# Patient Record
Sex: Male | Born: 1975 | Race: White | Hispanic: No | Marital: Married | Smoking: Never smoker
Health system: Southern US, Community
[De-identification: ages and names within clinical notes are randomized; demographics above are authoritative.]

## PROBLEM LIST (undated history)

## (undated) DIAGNOSIS — K5010 Crohn's disease of large intestine without complications: Secondary | ICD-10-CM

## (undated) HISTORY — PX: COLONOSCOPY: SHX174

## (undated) HISTORY — DX: Crohn's disease of large intestine without complications: K50.10

## (undated) HISTORY — PX: MOHS SURGERY: SHX181

## (undated) HISTORY — PX: ANAL FISSURECTOMY: SUR608

---

## 2009-04-07 ENCOUNTER — Emergency Department (HOSPITAL_COMMUNITY): Admission: EM | Admit: 2009-04-07 | Discharge: 2009-04-07 | Payer: Self-pay | Admitting: Emergency Medicine

## 2010-10-25 LAB — URINALYSIS, ROUTINE W REFLEX MICROSCOPIC
Ketones, ur: 40 mg/dL — AB
Nitrite: NEGATIVE
Protein, ur: 30 mg/dL — AB
pH: 6 (ref 5.0–8.0)

## 2010-10-25 LAB — URINE MICROSCOPIC-ADD ON

## 2010-10-25 LAB — URINE CULTURE: Culture: NO GROWTH

## 2011-02-20 IMAGING — CT CT PELVIS W/O CM
2 of 4 series · 17 of 46 positions shown, 19 images · non-contrast
Comparison: None

CT ABDOMEN

CLINICAL DATA: Right-sided flank, abdominal, and pelvic pain with
hematuria and dysuria.

CT ABDOMEN AND PELVIS WITHOUT CONTRAST
TECHNIQUE: Multidetector CT imaging of the abdomen and pelvis was
performed following the standard protocol without intravenous
contrast.

[Series 2: a/p w/o 5.0 b31f st · axial · non-contrast · 0.78mm/px · z∈[-431,+9]mm · 14 of 96 slices shown, 16 images]
[im 4/96  soft-tissue]
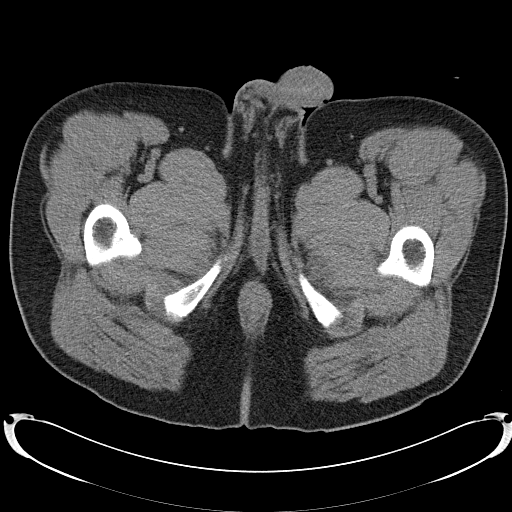
[im 4/96  bone]
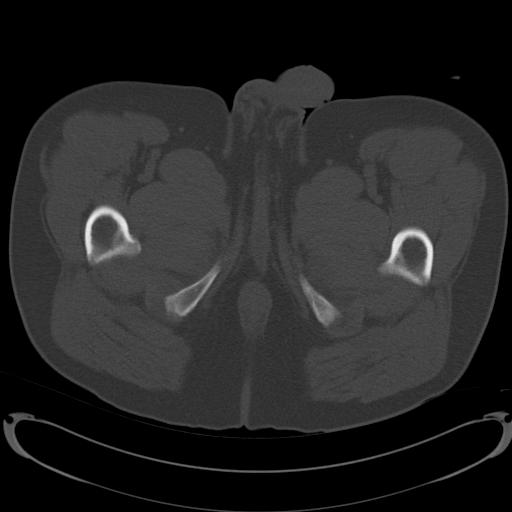
[im 12/96  soft-tissue]
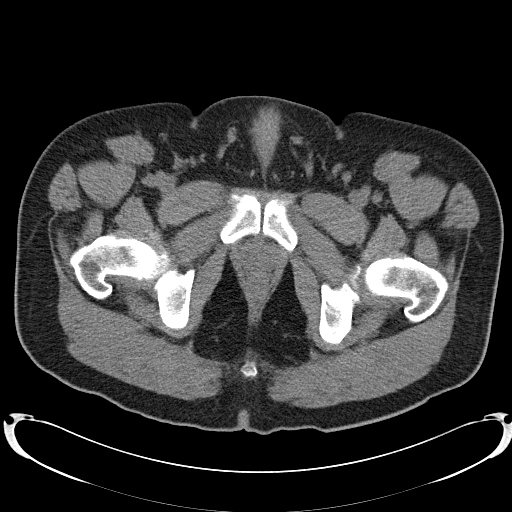
[im 20/96  soft-tissue]
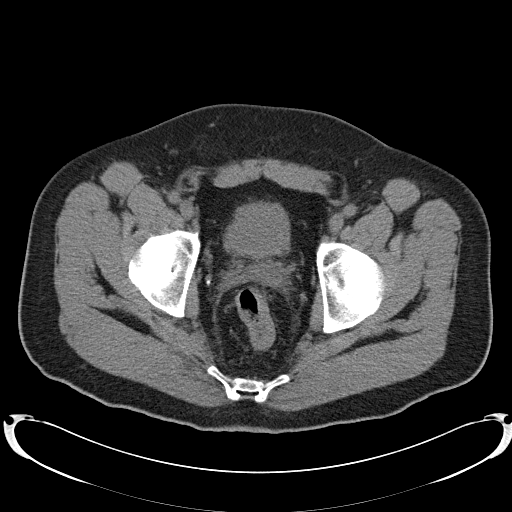
[im 24/96  soft-tissue]
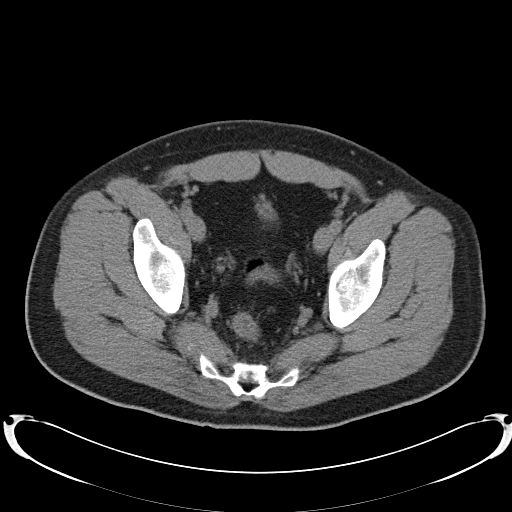
[im 32/96  soft-tissue]
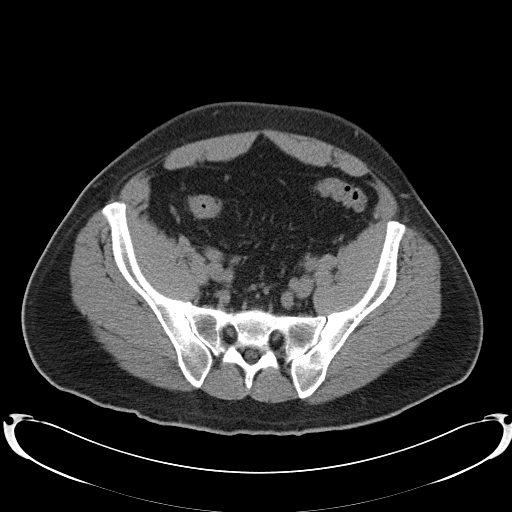
[im 40/96  soft-tissue]
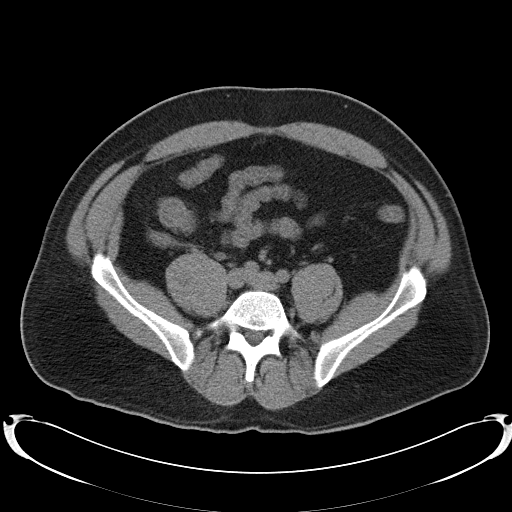
[im 44/96  soft-tissue]
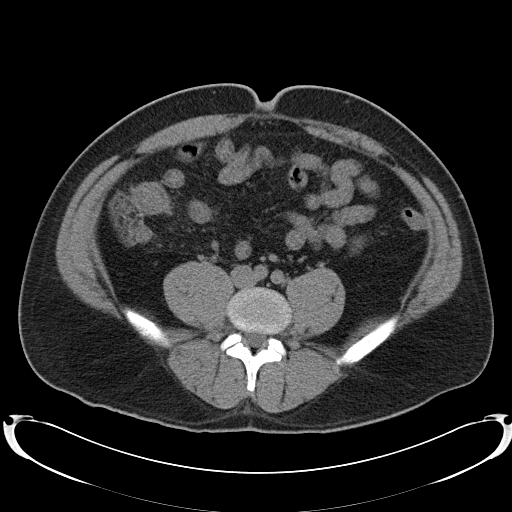
[im 52/96  soft-tissue]
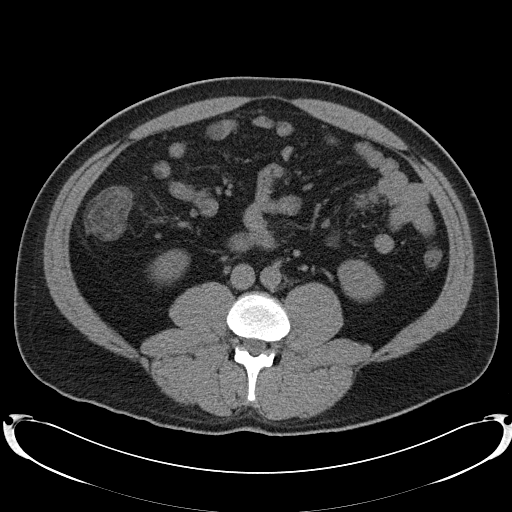
[im 56/96  soft-tissue]
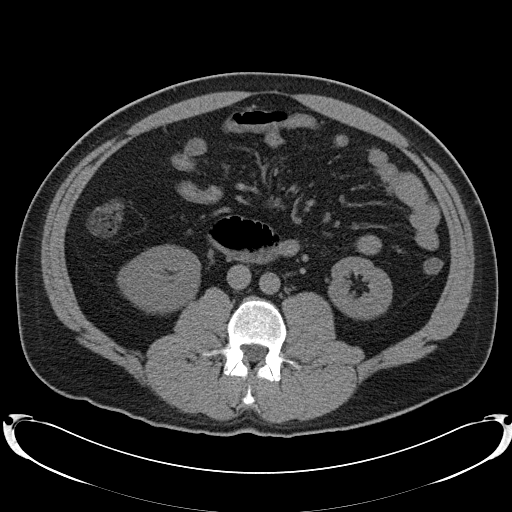
[im 56/96  bone]
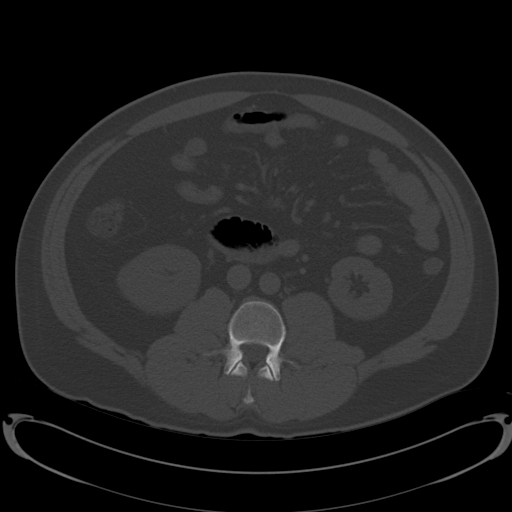
[im 64/96  soft-tissue]
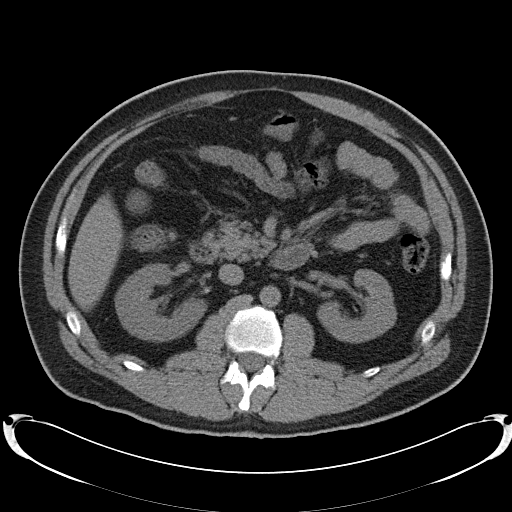
[im 72/96  soft-tissue]
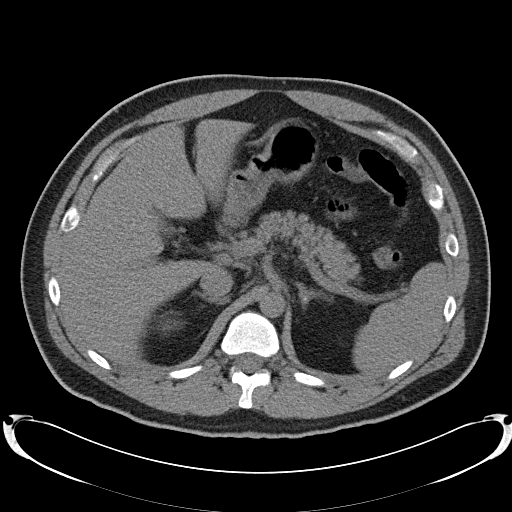
[im 76/96  soft-tissue]
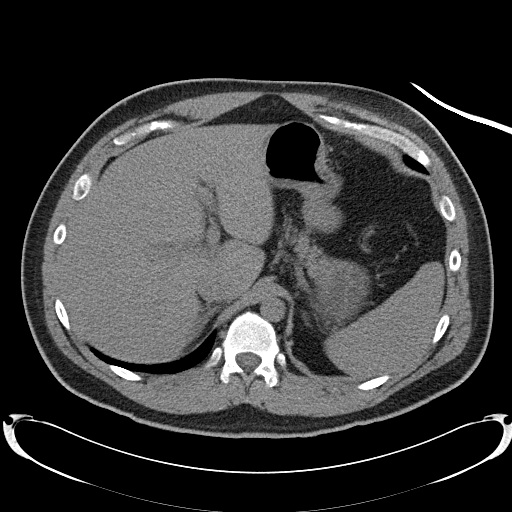
[im 84/96  soft-tissue]
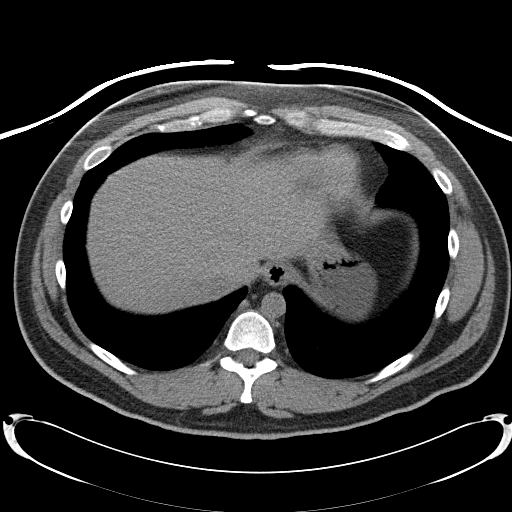
[im 92/96  soft-tissue]
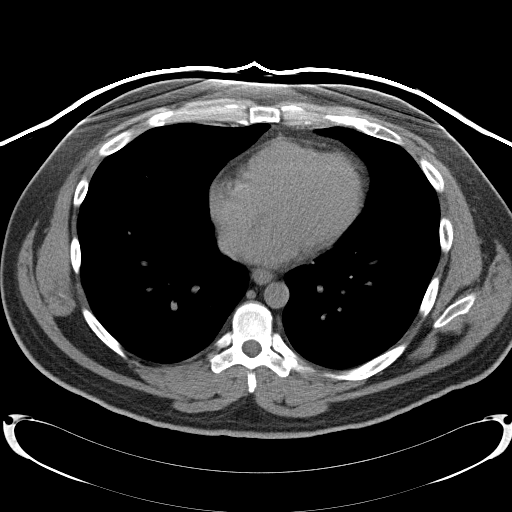

[Series 602: cor · coronal · 0.93mm/px · 3 of 143 slices shown]
[im 48/143  soft-tissue]
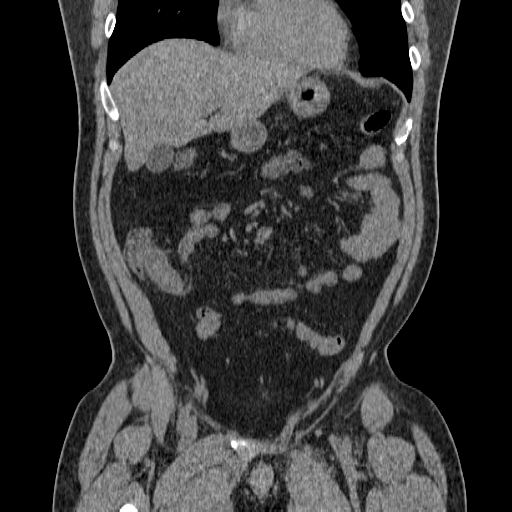
[im 64/143  soft-tissue]
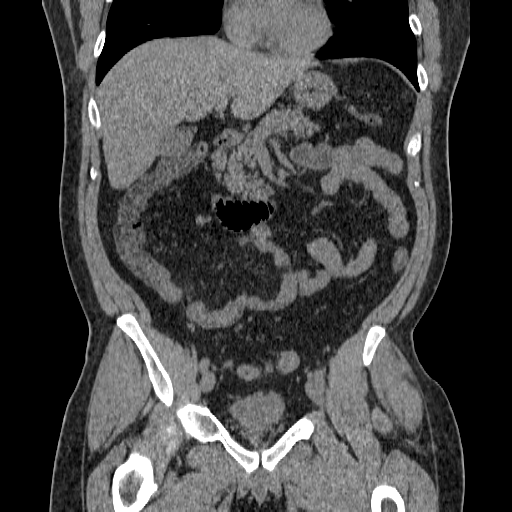
[im 79/143  soft-tissue]
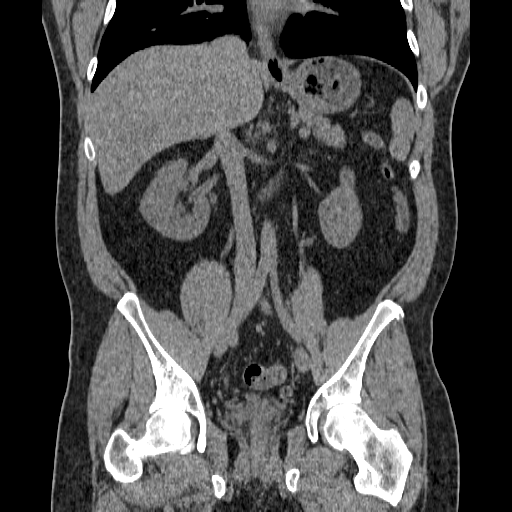

[17 of 46 positions shown; findings below may reference images not displayed]

FINDINGS: The liver, spleen, adrenal glands, pancreas,
gallbladder, and left kidney are unremarkable.
Mild right hydroureteronephrosis is caused by a 1 mm right UVJ
calculus within the pelvis.
Please note that parenchymal abnormalities may be missed as
intravenous contrast was not administered.
No free fluid, enlarged lymph nodes, biliary dilation or abdominal
aortic aneurysm identified.
The visualized bowel is unremarkable.  The appendix is normal.
No acute or suspicious bony abnormalities are identified.
IMPRESSION: Mild right hydroureteronephrosis caused by a 1 mm right UVJ
calculus.

CT PELVIS
FINDINGS: A 1 mm right UVJ calculus is identified causing mild
right hydroureteronephrosis.
There is no evidence of free fluid or enlarged lymph nodes within
the pelvis.
The bowel and bladder are within normal limits otherwise.
No acute or suspicious bony abnormalities are noted.
Small bilateral inguinal hernias containing fat are noted.
IMPRESSION: 1 mm right UVJ calculus causing mild right hydroureteronephrosis.

## 2012-03-31 ENCOUNTER — Ambulatory Visit (INDEPENDENT_AMBULATORY_CARE_PROVIDER_SITE_OTHER): Payer: BC Managed Care – PPO | Admitting: Family Medicine

## 2012-03-31 ENCOUNTER — Ambulatory Visit (HOSPITAL_COMMUNITY)
Admission: RE | Admit: 2012-03-31 | Discharge: 2012-03-31 | Disposition: A | Discharge status: Home / Self Care | Payer: BC Managed Care – PPO | Source: Ambulatory Visit | Attending: Family Medicine | Admitting: Family Medicine

## 2012-03-31 ENCOUNTER — Other Ambulatory Visit: Payer: Self-pay | Admitting: Radiology

## 2012-03-31 VITALS — BP 140/92 | HR 83 | Temp 98.5°F | Resp 16 | Ht 65.0 in | Wt 200.0 lb

## 2012-03-31 DIAGNOSIS — R600 Localized edema: Secondary | ICD-10-CM

## 2012-03-31 DIAGNOSIS — M79609 Pain in unspecified limb: Secondary | ICD-10-CM

## 2012-03-31 DIAGNOSIS — M79606 Pain in leg, unspecified: Secondary | ICD-10-CM

## 2012-03-31 DIAGNOSIS — R609 Edema, unspecified: Secondary | ICD-10-CM

## 2012-03-31 DIAGNOSIS — M7989 Other specified soft tissue disorders: Secondary | ICD-10-CM

## 2012-03-31 LAB — POCT CBC
Granulocyte percent: 74.8 %G (ref 37–80)
HCT, POC: 52.6 % (ref 43.5–53.7)
MCV: 91.4 fL (ref 80–97)
RBC: 5.76 M/uL (ref 4.69–6.13)

## 2012-03-31 MED ORDER — METHYLPREDNISOLONE 4 MG PO KIT: PACK | ORAL | Status: AC

## 2012-03-31 NOTE — Progress Notes (Signed)
*  PRELIMINARY RESULTS* Vascular Ultrasound Right lower extremity venous duplex has been completed.  Preliminary findings: no evidence of DVT.  Called report to Amy at Dr. Frederik Pear office.  Farrel Demark, RDMS, RVT  03/31/2012, 12:28 PM

## 2012-03-31 NOTE — Progress Notes (Signed)
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 About a week ago he developed swelling in his right foot. It is persisted. On the weekend when he had it elevated and the swelling seemed to go down some, but when he got back up on it it is swollen again. He's developed some pain and tenderness on the medial aspect of the right calf, extending on up medial to the D. and for several inches along the lower medial thigh. He has no history of clotting disorder or clot problems, and no family history of it. He has not been flying. He had no injuries.  Objective: Tender along the left medial thigh and calf as noted above. No erythema. The leg is visibly a little swollen compared to the left. The foot is very swollen and puffy looking. Pedal pulses are present, though a little harder to palpate due to the swelling. Homans sign is essentially negative. He gets pain along the medial ankle with with dorsiflexion of the foot, but did not seem to complain of deep calf pain.  Assessment: Right leg swollen and painful Suspicion of DVT right leg  Plan: Ultrasound CBC, sed rate  Results for orders placed in visit on 03/31/12  POCT CBC      Component Value Range   WBC 8.0  4.6 - 10.2 K/uL   Lymph, poc 1.3  0.6 - 3.4   POC LYMPH PERCENT 16.2  10 - 50 %L   MID (cbc) 0.7  0 - 0.9   POC MID % 9.0  0 - 12 %M   POC Granulocyte 6.0  2 - 6.9   Granulocyte percent 74.8  37 - 80 %G   RBC 5.76  4.69 - 6.13 M/uL   Hemoglobin 16.4  14.1 - 18.1 g/dL   HCT, POC 14.7  82.9 - 53.7 %   MCV 91.4  80 - 97 fL   MCH, POC 28.5  27 - 31.2 pg   MCHC 31.2 (*) 31.8 - 35.4 g/dL   RDW, POC 56.2     Platelet Count, POC 334  142 - 424 K/uL   MPV 9.5  0 - 99.8 fL  POCT SEDIMENTATION RATE      Component Value Range   POCT SED RATE 23 (*) 0 - 22 mm/hr   Ultrasound did not show any evidence of clot. I am not sure exactly what going on but he may have just some venous  inflammation. I am going ahead and treating him with a Medrol Dosepak if that is okay with him. He does have Crohn's disease and he speaks careful about his medications, so we need to avoid NSAIDs.

## 2012-03-31 NOTE — Patient Instructions (Signed)
Daniel Mcintyre . 1st floor admitting to register as outpatient, then to vascular lab for scan.

## 2012-03-31 NOTE — Telephone Encounter (Signed)
done

## 2012-03-31 NOTE — Addendum Note (Signed)
Addended by: HOPPER, DAVID H on: 03/31/2012 02:26 PM   Modules accepted: Orders

## 2012-03-31 NOTE — Telephone Encounter (Signed)
Per Dr. Alwyn Ren, advise patient that sed rate minimally elevated at 23 and that he recommends course of Medrol dosepack to help alleviate symptoms.  Patient aware and ok with plan.  To MD, please rx to [facility] on [address]

## 2012-03-31 NOTE — Progress Notes (Signed)
  Subjective:    Patient ID: Daniel Mcintyre, male    DOB: 1975-11-02, 36 y.o.   MRN: [id number]  HPI    Review of Systems     Objective:   Physical Exam        Assessment & Plan:

## 2012-03-31 NOTE — Telephone Encounter (Signed)
Patient went for doppler study, [place] at vascular lab advises scan negative for DVT. Patient advised and told we will call with further instructions. Please advise and I will call patient, I have chart.

## 2012-04-24 ENCOUNTER — Ambulatory Visit: Payer: BC Managed Care – PPO

## 2012-04-24 ENCOUNTER — Ambulatory Visit (INDEPENDENT_AMBULATORY_CARE_PROVIDER_SITE_OTHER): Payer: BC Managed Care – PPO | Admitting: Family Medicine

## 2012-04-24 VITALS — BP 127/78 | HR 88 | Temp 98.2°F | Resp 18 | Ht 64.5 in | Wt 201.8 lb

## 2012-04-24 DIAGNOSIS — M79609 Pain in unspecified limb: Secondary | ICD-10-CM

## 2012-04-24 DIAGNOSIS — R600 Localized edema: Secondary | ICD-10-CM

## 2012-04-24 DIAGNOSIS — M79671 Pain in right foot: Secondary | ICD-10-CM

## 2012-04-24 DIAGNOSIS — R609 Edema, unspecified: Secondary | ICD-10-CM

## 2012-04-24 LAB — POCT URINALYSIS DIPSTICK
Bilirubin, UA: NEGATIVE
Blood, UA: NEGATIVE
Glucose, UA: NEGATIVE
Leukocytes, UA: NEGATIVE
Nitrite, UA: NEGATIVE
Protein, UA: NEGATIVE
Spec Grav, UA: >=1.03
Urobilinogen, UA: 0.2
pH, UA: 5.5

## 2012-04-24 NOTE — Progress Notes (Signed)
[facility]:  Office Visit  Chief Complaint:  Chief Complaint  Patient presents with  . Foot Swelling    R Foot swelling x 1 month    HPI: Daniel Mcintyre is a 36 y.o. male who complains of: Right foot swelling and leg pain since 03/31/2012, venous doppler was negative for DVT. He is on Asacol and Budesonide for Crohn's colitis which can cause some edema but it is very minimal , < 5%. He has been on these medicine for awhile so this is something new. He also drives to [facility] to work, he is a Theme park manager in the ICU. Denies any chest pain or bilateral lower extremity swelling. Has right Jolane Bankhead and foot swelling with pain. Improves when he elevates it. He denies having any pain with walking. He states the swelling decreases a little when he elevates it. He has never had any vascular insufficieny issues, PAD, wound healing issues with his feet, diabetes.   The Venous Doppler stated no  E/io DVT but I am unable to see if they looked at the valves of his leg for reflux.    Past Medical History  Diagnosis Date  . Crohn's colitis    History reviewed. No pertinent past surgical history. History   Social History  . Marital Status: Single    Spouse Name: N/A    Number of Children: N/A  . Years of Education: N/A   Social History Main Topics  . Smoking status: Never Smoker   . Smokeless tobacco: None  . Alcohol Use: No  . Drug Use: No  . Sexually Active: None   Other Topics Concern  . None   Social History Narrative  . None   No family history on file. No Known Allergies Prior to Admission medications   Medication Sig Start Date End Date Taking? Authorizing Provider  budesonide (ENTOCORT EC) 3 MG 24 hr capsule Take 3 mg by mouth daily.   Yes Historical Provider, MD  mesalamine (PENTASA) 250 MG CR capsule Take 250 mg by mouth 4 (four) times daily.   Yes Historical Provider, MD     ROS: The patient denies fevers, chills, night sweats, unintentional weight loss,  chest pain, palpitations, wheezing, dyspnea on exertion, nausea, vomiting, abdominal pain, dysuria, hematuria, melena, numbness, weakness, or tingling.   All other systems have been reviewed and were otherwise negative with the exception of those mentioned in the HPI and as above.    PHYSICAL EXAM: Filed Vitals:   04/24/12 1508  BP: 127/78  Pulse: 88  Temp: 98.2 F (36.8 C)  Resp: 18   Filed Vitals:   04/24/12 1508  Height: 5' 4.5" (1.638 m)  Weight: 201 lb 12.8 oz (91.536 kg)   Body mass index is 34.10 kg/(m^2).  General: Alert, no acute distress HEENT:  Normocephalic, atraumatic, oropharynx patent.  Cardiovascular:  Regular rate and rhythm, no rubs murmurs or gallops.  No Carotid bruits, radial pulse intact. + right foot/pedal edema.  Respiratory: Clear to auscultation bilaterally.  No wheezes, rales, or rhonchi.  No cyanosis, no use of accessory musculature GI: No organomegaly, abdomen is soft and non-tender, positive bowel sounds.  No masses. Skin: No rashes. Neurologic: Facial musculature symmetric. Psychiatric: Patient is appropriate throughout our interaction. Lymphatic: No cervical lymphadenopathy Musculoskeletal: Gait intact. Right ankle-nl Right foot-+ 2 pedal edema on dorsum, ROM, intact, 5/5, sensation intact. + DP. No warmth, no redness.    LABS: Results for orders placed in visit on 04/24/12  POCT  URINALYSIS DIPSTICK      Component Value Range   Color, UA yellow     Clarity, UA clear     Glucose, UA neg     Bilirubin, UA neg     Ketones, UA trace     Spec Grav, UA >=1.030     Blood, UA neg     pH, UA 5.5     Protein, UA neg     Urobilinogen, UA 0.2     Nitrite, UA neg     Leukocytes, UA Negative       EKG/XRAY:   Primary read interpreted by Dr. Conley Rolls at [facility]. No fx/dislocation   ASSESSMENT/PLAN: Encounter Diagnoses  Name Primary?  . Edema of foot Yes  . Pain in right foot    Urine did not show e/o protein leakage. I assume kidneys are doing  ok based on this. Needs vascular study looking for venous and arterial insufficiency.  Will order it along with request for radiologist to interpret since they need to be present for the reflux study, Apparently they do this only on Tuesdays and Thursdays at [facility]. Venous inufficinecy and less arterial insufficiency can occur in patient with Crohn's Disease.  October 15 in the morning is his preference  Rockne Coons, DO 04/24/2012 4:24 PM

## 2012-04-29 ENCOUNTER — Telehealth: Payer: Self-pay | Admitting: Radiology

## 2012-04-29 NOTE — Telephone Encounter (Signed)
Have gotten call from vascular lab ? Order for study, Lupita Leash will ck on this then let me know.

## 2012-05-19 ENCOUNTER — Encounter: Payer: Self-pay | Admitting: *Deleted

## 2012-05-19 DIAGNOSIS — K5090 Crohn's disease, unspecified, without complications: Secondary | ICD-10-CM

## 2013-01-10 ENCOUNTER — Other Ambulatory Visit: Payer: Self-pay | Admitting: Gastroenterology

## 2013-01-10 DIAGNOSIS — K50919 Crohn's disease, unspecified, with unspecified complications: Secondary | ICD-10-CM

## 2013-01-17 ENCOUNTER — Ambulatory Visit
Admission: RE | Admit: 2013-01-17 | Discharge: 2013-01-17 | Disposition: A | Discharge status: Home / Self Care | Payer: BC Managed Care – PPO | Source: Ambulatory Visit | Attending: Gastroenterology | Admitting: Gastroenterology

## 2013-01-17 DIAGNOSIS — K50919 Crohn's disease, unspecified, with unspecified complications: Secondary | ICD-10-CM

## 2013-01-17 MED ORDER — IOHEXOL 300 MG/ML  SOLN
100.0000 mL | Freq: Once | INTRAMUSCULAR | Status: AC | PRN
  Administered 2013-01-17: 100 mL via INTRAVENOUS

## 2015-10-26 DIAGNOSIS — K5000 Crohn's disease of small intestine without complications: Secondary | ICD-10-CM | POA: Diagnosis not present

## 2015-10-26 DIAGNOSIS — K5080 Crohn's disease of both small and large intestine without complications: Secondary | ICD-10-CM | POA: Diagnosis not present

## 2015-10-26 DIAGNOSIS — K640 First degree hemorrhoids: Secondary | ICD-10-CM | POA: Diagnosis not present

## 2016-02-08 DIAGNOSIS — J069 Acute upper respiratory infection, unspecified: Secondary | ICD-10-CM | POA: Diagnosis not present

## 2016-03-21 DIAGNOSIS — H1789 Other corneal scars and opacities: Secondary | ICD-10-CM | POA: Diagnosis not present

## 2016-03-21 DIAGNOSIS — Z9889 Other specified postprocedural states: Secondary | ICD-10-CM | POA: Diagnosis not present

## 2016-04-09 DIAGNOSIS — H1789 Other corneal scars and opacities: Secondary | ICD-10-CM | POA: Diagnosis not present

## 2016-06-17 DIAGNOSIS — K5080 Crohn's disease of both small and large intestine without complications: Secondary | ICD-10-CM | POA: Diagnosis not present

## 2016-06-17 DIAGNOSIS — Z13220 Encounter for screening for lipoid disorders: Secondary | ICD-10-CM | POA: Diagnosis not present

## 2016-06-17 DIAGNOSIS — Z125 Encounter for screening for malignant neoplasm of prostate: Secondary | ICD-10-CM | POA: Diagnosis not present

## 2016-06-17 DIAGNOSIS — Z0000 Encounter for general adult medical examination without abnormal findings: Secondary | ICD-10-CM | POA: Diagnosis not present

## 2016-07-29 DIAGNOSIS — L720 Epidermal cyst: Secondary | ICD-10-CM | POA: Diagnosis not present

## 2016-07-29 DIAGNOSIS — L089 Local infection of the skin and subcutaneous tissue, unspecified: Secondary | ICD-10-CM | POA: Diagnosis not present

## 2016-08-05 DIAGNOSIS — L0291 Cutaneous abscess, unspecified: Secondary | ICD-10-CM | POA: Diagnosis not present

## 2016-08-05 DIAGNOSIS — L723 Sebaceous cyst: Secondary | ICD-10-CM | POA: Diagnosis not present

## 2016-08-05 DIAGNOSIS — L0211 Cutaneous abscess of neck: Secondary | ICD-10-CM | POA: Diagnosis not present

## 2016-08-05 DIAGNOSIS — L089 Local infection of the skin and subcutaneous tissue, unspecified: Secondary | ICD-10-CM | POA: Diagnosis not present

## 2016-08-05 DIAGNOSIS — K5090 Crohn's disease, unspecified, without complications: Secondary | ICD-10-CM | POA: Diagnosis not present

## 2016-08-08 DIAGNOSIS — Z4801 Encounter for change or removal of surgical wound dressing: Secondary | ICD-10-CM | POA: Diagnosis not present

## 2016-09-12 DIAGNOSIS — L0211 Cutaneous abscess of neck: Secondary | ICD-10-CM | POA: Diagnosis not present

## 2016-09-30 DIAGNOSIS — B370 Candidal stomatitis: Secondary | ICD-10-CM | POA: Diagnosis not present

## 2016-09-30 DIAGNOSIS — L0211 Cutaneous abscess of neck: Secondary | ICD-10-CM | POA: Diagnosis not present

## 2016-10-16 DIAGNOSIS — L723 Sebaceous cyst: Secondary | ICD-10-CM | POA: Diagnosis not present

## 2016-10-16 DIAGNOSIS — L089 Local infection of the skin and subcutaneous tissue, unspecified: Secondary | ICD-10-CM | POA: Diagnosis not present

## 2016-10-19 DIAGNOSIS — B379 Candidiasis, unspecified: Secondary | ICD-10-CM | POA: Diagnosis not present

## 2016-11-25 ENCOUNTER — Encounter (HOSPITAL_COMMUNITY)
Admission: RE | Admit: 2016-11-25 | Discharge: 2016-11-25 | Disposition: A | Discharge status: Home / Self Care | Payer: BLUE CROSS/BLUE SHIELD | Source: Ambulatory Visit | Attending: General Surgery | Admitting: General Surgery

## 2016-11-25 ENCOUNTER — Encounter (HOSPITAL_COMMUNITY): Payer: Self-pay

## 2016-11-25 DIAGNOSIS — L723 Sebaceous cyst: Secondary | ICD-10-CM | POA: Insufficient documentation

## 2016-11-25 DIAGNOSIS — Z01818 Encounter for other preprocedural examination: Secondary | ICD-10-CM | POA: Insufficient documentation

## 2016-11-25 LAB — BASIC METABOLIC PANEL
ANION GAP: 8 (ref 5–15)
BUN: 14 mg/dL (ref 6–20)
CHLORIDE: 105 mmol/L (ref 101–111)
CO2: 25 mmol/L (ref 22–32)
CREATININE: 0.87 mg/dL (ref 0.61–1.24)
Calcium: 9.6 mg/dL (ref 8.9–10.3)
GFR calc non Af Amer: >60 mL/min (ref >60)
Glucose, Bld: 120 mg/dL — ABNORMAL HIGH (ref 65–99)
POTASSIUM: 4.1 mmol/L (ref 3.5–5.1)
Sodium: 138 mmol/L (ref 135–145)

## 2016-11-25 LAB — CBC
HEMATOCRIT: 48.7 % (ref 39.0–52.0)
HEMOGLOBIN: 16.9 g/dL (ref 13.0–17.0)
MCH: 30.8 pg (ref 26.0–34.0)
MCHC: 34.7 g/dL (ref 30.0–36.0)
MCV: 88.9 fL (ref 78.0–100.0)
Platelets: 208 10*3/uL (ref 150–400)
RBC: 5.48 MIL/uL (ref 4.22–5.81)
RDW: 13 % (ref 11.5–15.5)
WBC: 6.7 10*3/uL (ref 4.0–10.5)

## 2016-11-25 NOTE — Pre-Procedure Instructions (Signed)
Daniel Mcintyre  11/25/2016      [facility] [phone] - [place], [place] - [address] [place] [address] Phone: [phone] Fax: [phone]  [facility] [phone] - [place], [place] - [address] [facility] [address] [place] [id number] Phone: [phone] Fax: [phone]    Your procedure is scheduled on Wednesday, Dec 03, 2016  [facility] 8:00 A.M.  Call this number if you have problems the morning of surgery:  [phone]   Remember:  Do not eat food or drink liquids after midnight Tuesday, Dec 02, 2016  Take these medicines the morning of surgery with A SIP OF WATER : None Stop taking Aspirin,vitamins, fish oil and herbal medications. Do not take any NSAIDs ie: Ibuprofen, Advil, Naproxen, BC and Goody Powder; stop Wednesday, Nov 26, 2016.  Do not wear jewelry, make-up or nail polish.  Do not wear lotions, powders, or perfumes, or deoderant.  Do not shave 48 hours prior to surgery.  Men may shave face and neck.  Do not bring valuables to the hospital.  [facility] is not responsible for any belongings or valuables.  Contacts, dentures or bridgework may not be worn into surgery.  Leave your suitcase in the car.  After surgery it may be brought to your room. For patients admitted to the hospital, discharge time will be determined by your treatment team. Patients discharged the day of surgery will not be allowed to drive home.  Special instructions:    - Preparing for Surgery  Before surgery, you can play an important role.  Because skin is not sterile, your skin needs to be as free of germs as possible.  You can reduce the number of germs on you skin by washing with CHG (chlorahexidine gluconate) soap before surgery.  CHG is an antiseptic cleaner which kills germs and bonds with the skin to continue killing germs even  after washing.  Please DO NOT use if you have an allergy to CHG or antibacterial soaps.  If your skin becomes reddened/irritated stop using the CHG and inform your nurse when you arrive at Short Stay.  Do not shave (including legs and underarms) for at least 48 hours prior to the first CHG shower.  You may shave your face.  Please follow these instructions carefully:   1.  Shower with CHG Soap the night before surgery and the morning of Surgery.  2.  If you choose to wash your hair, wash your hair first as usual with your normal shampoo.  3.  After you shampoo, rinse your hair and body thoroughly to remove the Shampoo.  4.  Use CHG as you would any other liquid soap.  You can apply chg directly  to the skin and wash gently with scrungie or a clean washcloth.  5.  Apply the CHG Soap to your body ONLY FROM THE NECK DOWN.  Do not use on open wounds or open sores.  Avoid contact with your eyes, ears, mouth and genitals (private parts).  Wash genitals (private parts) with your normal soap.  6.  Wash thoroughly, paying special attention to the area where your surgery will be performed.  7.  Thoroughly rinse your body with warm water from the neck down.  8.  DO NOT shower/wash with your normal soap after using  and rinsing off the CHG Soap.  9.  Pat yourself dry with a clean towel.            10.  Wear clean pajamas.            11.  Place clean sheets on your bed the night of your first shower and do not sleep with pets.  Day of Surgery  Do not apply any lotions/deodorants the morning of surgery.  Please wear clean clothes to the hospital/surgery center.  Please read over the following fact sheets that you were given. Pain Booklet, Coughing and Deep Breathing and Surgical Site Infection Prevention

## 2016-12-03 ENCOUNTER — Encounter (HOSPITAL_COMMUNITY): Payer: Self-pay

## 2016-12-03 ENCOUNTER — Ambulatory Visit: Payer: Self-pay | Admitting: General Surgery

## 2016-12-03 ENCOUNTER — Ambulatory Visit (HOSPITAL_COMMUNITY): Payer: BLUE CROSS/BLUE SHIELD | Admitting: Anesthesiology

## 2016-12-03 ENCOUNTER — Ambulatory Visit (HOSPITAL_COMMUNITY)
Admission: RE | Admit: 2016-12-03 | Discharge: 2016-12-03 | Disposition: A | Discharge status: Home / Self Care | Payer: BLUE CROSS/BLUE SHIELD | Source: Ambulatory Visit | Attending: General Surgery | Admitting: General Surgery

## 2016-12-03 ENCOUNTER — Encounter (HOSPITAL_COMMUNITY)
Admission: RE | Disposition: A | Discharge status: Home / Self Care | Payer: Self-pay | Source: Ambulatory Visit | Attending: General Surgery

## 2016-12-03 DIAGNOSIS — L720 Epidermal cyst: Secondary | ICD-10-CM | POA: Diagnosis not present

## 2016-12-03 DIAGNOSIS — L723 Sebaceous cyst: Secondary | ICD-10-CM | POA: Insufficient documentation

## 2016-12-03 HISTORY — PX: EXCISION MASS NECK: SHX6703

## 2016-12-03 SURGERY — EXCISION, MASS, NECK
Anesthesia: General | Site: Neck

## 2016-12-03 MED ORDER — FENTANYL CITRATE (PF) 250 MCG/5ML IJ SOLN
INTRAMUSCULAR | Status: AC
  Filled 2016-12-03: qty 5

## 2016-12-03 MED ORDER — SUGAMMADEX SODIUM 200 MG/2ML IV SOLN
INTRAVENOUS | Status: DC | PRN
  Administered 2016-12-03: 379.2 mg via INTRAVENOUS

## 2016-12-03 MED ORDER — ROCURONIUM BROMIDE 10 MG/ML (PF) SYRINGE
PREFILLED_SYRINGE | INTRAVENOUS | Status: AC
  Filled 2016-12-03: qty 10

## 2016-12-03 MED ORDER — CHLORHEXIDINE GLUCONATE CLOTH 2 % EX PADS: 6.0000 | MEDICATED_PAD | Freq: Once | CUTANEOUS | Status: DC

## 2016-12-03 MED ORDER — CELECOXIB 200 MG PO CAPS
400.0000 mg | ORAL_CAPSULE | ORAL | Status: AC
  Administered 2016-12-03: 400 mg via ORAL
  Filled 2016-12-03: qty 2

## 2016-12-03 MED ORDER — BUPIVACAINE HCL (PF) 0.25 % IJ SOLN
INTRAMUSCULAR | Status: AC
  Filled 2016-12-03: qty 30

## 2016-12-03 MED ORDER — ALBUTEROL SULFATE HFA 108 (90 BASE) MCG/ACT IN AERS
INHALATION_SPRAY | RESPIRATORY_TRACT | Status: DC | PRN
  Administered 2016-12-03: 5 via RESPIRATORY_TRACT

## 2016-12-03 MED ORDER — MIDAZOLAM HCL 5 MG/5ML IJ SOLN
INTRAMUSCULAR | Status: DC | PRN
  Administered 2016-12-03: 2 mg via INTRAVENOUS

## 2016-12-03 MED ORDER — PROMETHAZINE HCL 25 MG/ML IJ SOLN: 6.2500 mg | INTRAMUSCULAR | Status: DC | PRN

## 2016-12-03 MED ORDER — EPHEDRINE 5 MG/ML INJ
INTRAVENOUS | Status: AC
  Filled 2016-12-03: qty 10

## 2016-12-03 MED ORDER — FENTANYL CITRATE (PF) 100 MCG/2ML IJ SOLN
INTRAMUSCULAR | Status: DC | PRN
  Administered 2016-12-03 (×2): 100 ug via INTRAVENOUS
  Administered 2016-12-03: 50 ug via INTRAVENOUS

## 2016-12-03 MED ORDER — EPHEDRINE SULFATE 50 MG/ML IJ SOLN
INTRAMUSCULAR | Status: DC | PRN
  Administered 2016-12-03: 10 mg via INTRAVENOUS
  Administered 2016-12-03: 5 mg via INTRAVENOUS

## 2016-12-03 MED ORDER — GABAPENTIN 300 MG PO CAPS
300.0000 mg | ORAL_CAPSULE | ORAL | Status: AC
  Administered 2016-12-03: 300 mg via ORAL
  Filled 2016-12-03: qty 1

## 2016-12-03 MED ORDER — LIDOCAINE 2% (20 MG/ML) 5 ML SYRINGE
INTRAMUSCULAR | Status: DC | PRN
  Administered 2016-12-03: 100 mg via INTRAVENOUS

## 2016-12-03 MED ORDER — ONDANSETRON HCL 4 MG/2ML IJ SOLN
INTRAMUSCULAR | Status: DC | PRN
Start: 2016-12-03 — End: 2016-12-03
  Administered 2016-12-03: 4 mg via INTRAVENOUS

## 2016-12-03 MED ORDER — DEXAMETHASONE SODIUM PHOSPHATE 10 MG/ML IJ SOLN
INTRAMUSCULAR | Status: DC | PRN
  Administered 2016-12-03: 10 mg via INTRAVENOUS

## 2016-12-03 MED ORDER — HYDROCODONE-ACETAMINOPHEN 5-325 MG PO TABS: 1.0000 | ORAL_TABLET | ORAL | 0 refills | Status: AC | PRN

## 2016-12-03 MED ORDER — PROPOFOL 10 MG/ML IV BOLUS
INTRAVENOUS | Status: AC
  Filled 2016-12-03: qty 20

## 2016-12-03 MED ORDER — PHENYLEPHRINE 40 MCG/ML (10ML) SYRINGE FOR IV PUSH (FOR BLOOD PRESSURE SUPPORT)
PREFILLED_SYRINGE | INTRAVENOUS | Status: AC
  Filled 2016-12-03: qty 10

## 2016-12-03 MED ORDER — ROCURONIUM BROMIDE 100 MG/10ML IV SOLN
INTRAVENOUS | Status: DC | PRN
  Administered 2016-12-03 (×2): 50 mg via INTRAVENOUS

## 2016-12-03 MED ORDER — PROPOFOL 10 MG/ML IV BOLUS
INTRAVENOUS | Status: DC | PRN
  Administered 2016-12-03: 200 mg via INTRAVENOUS

## 2016-12-03 MED ORDER — ONDANSETRON HCL 4 MG/2ML IJ SOLN
INTRAMUSCULAR | Status: AC
  Filled 2016-12-03: qty 2

## 2016-12-03 MED ORDER — ACETAMINOPHEN 500 MG PO TABS
1000.0000 mg | ORAL_TABLET | ORAL | Status: AC
  Administered 2016-12-03: 1000 mg via ORAL
  Filled 2016-12-03: qty 2

## 2016-12-03 MED ORDER — DEXAMETHASONE SODIUM PHOSPHATE 10 MG/ML IJ SOLN
INTRAMUSCULAR | Status: AC
  Filled 2016-12-03: qty 1

## 2016-12-03 MED ORDER — BACITRACIN-NEOMYCIN-POLYMYXIN 400-5-5000 EX OINT
TOPICAL_OINTMENT | CUTANEOUS | Status: AC
  Filled 2016-12-03: qty 1

## 2016-12-03 MED ORDER — MIDAZOLAM HCL 2 MG/2ML IJ SOLN
INTRAMUSCULAR | Status: AC
  Filled 2016-12-03: qty 2

## 2016-12-03 MED ORDER — CEFAZOLIN SODIUM-DEXTROSE 2-4 GM/100ML-% IV SOLN
2.0000 g | INTRAVENOUS | Status: AC
  Administered 2016-12-03: 2 g via INTRAVENOUS
  Filled 2016-12-03: qty 100

## 2016-12-03 MED ORDER — ROCURONIUM BROMIDE 10 MG/ML (PF) SYRINGE
PREFILLED_SYRINGE | INTRAVENOUS | Status: AC
  Filled 2016-12-03: qty 5

## 2016-12-03 MED ORDER — NEOSTIGMINE METHYLSULFATE 5 MG/5ML IV SOSY
PREFILLED_SYRINGE | INTRAVENOUS | Status: AC
  Filled 2016-12-03: qty 5

## 2016-12-03 MED ORDER — BUPIVACAINE HCL (PF) 0.25 % IJ SOLN
INTRAMUSCULAR | Status: DC | PRN
  Administered 2016-12-03: 10 mL

## 2016-12-03 MED ORDER — HYDROMORPHONE HCL 1 MG/ML IJ SOLN: 0.2500 mg | INTRAMUSCULAR | Status: DC | PRN

## 2016-12-03 MED ORDER — LIDOCAINE 2% (20 MG/ML) 5 ML SYRINGE
INTRAMUSCULAR | Status: AC
  Filled 2016-12-03: qty 5

## 2016-12-03 MED ORDER — PHENYLEPHRINE HCL 10 MG/ML IJ SOLN
INTRAMUSCULAR | Status: DC | PRN
  Administered 2016-12-03: 120 ug via INTRAVENOUS
  Administered 2016-12-03 (×2): 160 ug via INTRAVENOUS
  Administered 2016-12-03: 80 ug via INTRAVENOUS

## 2016-12-03 MED ORDER — LIDOCAINE 2% (20 MG/ML) 5 ML SYRINGE
INTRAMUSCULAR | Status: AC
  Filled 2016-12-03: qty 10

## 2016-12-03 MED ORDER — LACTATED RINGERS IV SOLN
INTRAVENOUS | Status: DC | PRN
  Administered 2016-12-03 (×2): via INTRAVENOUS

## 2016-12-03 SURGICAL SUPPLY — 37 items
CANISTER SUCT 3000ML PPV (MISCELLANEOUS) IMPLANT
CHLORAPREP W/TINT 26ML (MISCELLANEOUS) ×3 IMPLANT
COVER SURGICAL LIGHT HANDLE (MISCELLANEOUS) ×3 IMPLANT
DECANTER SPIKE VIAL GLASS SM (MISCELLANEOUS) IMPLANT
DERMABOND ADVANCED (GAUZE/BANDAGES/DRESSINGS)
DERMABOND ADVANCED .7 DNX12 (GAUZE/BANDAGES/DRESSINGS) IMPLANT
DRAPE LAPAROTOMY 100X72 PEDS (DRAPES) ×3 IMPLANT
DRAPE UTILITY XL STRL (DRAPES) ×6 IMPLANT
ELECT CAUTERY BLADE 6.4 (BLADE) ×3 IMPLANT
ELECT REM PT RETURN 9FT ADLT (ELECTROSURGICAL) ×3
ELECTRODE REM PT RTRN 9FT ADLT (ELECTROSURGICAL) ×1 IMPLANT
GAUZE SPONGE 4X4 12PLY STRL (GAUZE/BANDAGES/DRESSINGS) IMPLANT
GAUZE SPONGE 4X4 12PLY STRL LF (GAUZE/BANDAGES/DRESSINGS) ×3 IMPLANT
GLOVE BIO SURGEON STRL SZ7.5 (GLOVE) ×3 IMPLANT
GOWN STRL REUS W/ TWL LRG LVL3 (GOWN DISPOSABLE) ×2 IMPLANT
GOWN STRL REUS W/TWL LRG LVL3 (GOWN DISPOSABLE) ×4
KIT BASIN OR (CUSTOM PROCEDURE TRAY) ×3 IMPLANT
KIT ROOM TURNOVER OR (KITS) ×3 IMPLANT
NEEDLE HYPO 25X1 1.5 SAFETY (NEEDLE) ×3 IMPLANT
NS IRRIG 1000ML POUR BTL (IV SOLUTION) ×3 IMPLANT
PACK SURGICAL SETUP 50X90 (CUSTOM PROCEDURE TRAY) ×3 IMPLANT
PAD ARMBOARD 7.5X6 YLW CONV (MISCELLANEOUS) ×3 IMPLANT
PENCIL BUTTON HOLSTER BLD 10FT (ELECTRODE) ×3 IMPLANT
SPECIMEN JAR SMALL (MISCELLANEOUS) IMPLANT
SPONGE LAP 18X18 X RAY DECT (DISPOSABLE) ×3 IMPLANT
SUT ETHILON 3 0 PS 1 (SUTURE) ×6 IMPLANT
SUT MNCRL AB 4-0 PS2 18 (SUTURE) ×3 IMPLANT
SUT VIC AB 3-0 SH 27 (SUTURE) ×2
SUT VIC AB 3-0 SH 27XBRD (SUTURE) ×1 IMPLANT
SYR BULB 3OZ (MISCELLANEOUS) ×3 IMPLANT
SYR CONTROL 10ML LL (SYRINGE) ×3 IMPLANT
TAPE CLOTH SURG 6X10 WHT LF (GAUZE/BANDAGES/DRESSINGS) ×3 IMPLANT
TOWEL OR 17X24 6PK STRL BLUE (TOWEL DISPOSABLE) ×3 IMPLANT
TOWEL OR 17X26 10 PK STRL BLUE (TOWEL DISPOSABLE) ×3 IMPLANT
TUBE CONNECTING 12'X1/4 (SUCTIONS)
TUBE CONNECTING 12X1/4 (SUCTIONS) IMPLANT
YANKAUER SUCT BULB TIP NO VENT (SUCTIONS) ×3 IMPLANT

## 2016-12-03 NOTE — Anesthesia Preprocedure Evaluation (Addendum)
Anesthesia Evaluation  Patient identified by MRN, date of birth, ID band Patient awake    Reviewed: Allergy & Precautions, NPO status , Patient's Chart, lab work & pertinent test results  Airway Mallampati: II  TM Distance: >3 FB Neck ROM: Full    Dental no notable dental hx. (+) Dental Advisory Given   Pulmonary neg pulmonary ROS,    Pulmonary exam normal        Cardiovascular negative cardio ROS Normal cardiovascular exam     Neuro/Psych negative neurological ROS  negative psych ROS   GI/Hepatic Neg liver ROS, Crohn's   Endo/Other  negative endocrine ROS  Renal/GU negative Renal ROS  negative genitourinary   Musculoskeletal negative musculoskeletal ROS (+)   Abdominal   Peds negative pediatric ROS (+)  Hematology negative hematology ROS (+)   Anesthesia Other Findings   Reproductive/Obstetrics negative OB ROS                             Anesthesia Physical Anesthesia Plan  ASA: II  Anesthesia Plan: General   Post-op Pain Management:    Induction: Intravenous  Airway Management Planned: Oral ETT  Additional Equipment:   Intra-op Plan:   Post-operative Plan: Extubation in OR  Informed Consent: I have reviewed the patients History and Physical, chart, labs and discussed the procedure including the risks, benefits and alternatives for the proposed anesthesia with the patient or authorized representative who has indicated his/her understanding and acceptance.   Dental advisory given  Plan Discussed with: CRNA and Anesthesiologist  Anesthesia Plan Comments:        Anesthesia Quick Evaluation

## 2016-12-03 NOTE — Transfer of Care (Deleted)
Immediate Anesthesia Transfer of Care Note  Patient: Daniel Mcintyre  Procedure(s) Performed: Procedure(s): EXCISION SEBACEOUS CYST NECK (N/A)  Patient Location: PACU  Anesthesia Type:General  Level of Consciousness: patient cooperative and responds to stimulation  Airway & Oxygen Therapy: Patient Spontanous Breathing and Patient connected to nasal cannula oxygen  Post-op Assessment: Report given to RN and Post -op Vital signs reviewed and stable  Post vital signs: Reviewed and stable  Last Vitals:  Vitals:   12/03/16 0759  BP: 133/81  Pulse: 76  Resp: 18  Temp: 36.9 C    Last Pain:  Vitals:   12/03/16 0759  TempSrc: Oral      Patients Stated Pain Goal: 4 (12/03/16 0815)  Complications: No apparent anesthesia complications

## 2016-12-03 NOTE — Op Note (Addendum)
12/03/2016  10:35 AM  PATIENT:  Daniel Mcintyre  41 y.o. male  PRE-OPERATIVE DIAGNOSIS:  Sebaceous cyst neck  POST-OPERATIVE DIAGNOSIS:  Sebaceous cyst neck  PROCEDURE:  Procedure(s): EXCISION SEBACEOUS CYST NECK (N/A)  SURGEON:  Surgeon(s) and Role:    Griselda Miner* Toth, Beila Purdie III, MD - Primary  PHYSICIAN ASSISTANT:   ASSISTANTS: none   ANESTHESIA:   local and general  EBL:  Total I/O In: 1000 [I.V.:1000] Out: -   BLOOD ADMINISTERED:none  DRAINS: none   LOCAL MEDICATIONS USED:  MARCAINE     SPECIMEN:  Source of Specimen:  sebaceous cyst neck  DISPOSITION OF SPECIMEN:  PATHOLOGY  COUNTS:  YES  TOURNIQUET:  * No tourniquets in log *  DICTATION: .Dragon Dictation   After informed consent was obtained the patient was brought to the operating room and placed in the supine position on the operating room table. After adequate induction of general anesthesia the patient was moved into a right side up lateral position on a beanbag and all pressure points are padded. The posterior right neck was prepped with ChloraPrep, allowed to dry, and draped in usual sterile manner. An appropriate timeout was performed. The area around the sebaceous cyst was infiltrated with quarter percent Marcaine. An elliptical incision was made around the cyst with a 15 blade knife. The incision was carried through the skin and subcutaneous tissue sharply with electrocautery until the entire cyst was removed. The cyst was then sent to pathology for further evaluation. The excised area measured 5X2cm. Hemostasis was achieved using the Bovie electrocautery. The incision was then irrigated with saline and closed with a combination of 3-0 nylon vertical mattress stitches as well as simple 3-0 nylon stitches. Once the incision was closed incision was coated with triple antibiotic ointment. Sterile dressings were applied. The patient tolerated the procedure well. At the end of the case all needle sponge and instrument counts  were correct. The patient was then awakened and taken to recovery in stable condition.  PLAN OF CARE: Discharge to home after PACU  PATIENT DISPOSITION:  PACU - hemodynamically stable.   Delay start of Pharmacological VTE agent (>24hrs) due to surgical blood loss or risk of bleeding: not applicable

## 2016-12-03 NOTE — H&P (Signed)
Daniel Mcintyre is an 41 y.o. male.   Chief Complaint: sebaceous cyst neck HPI: The patient is a 41 year old white male who has had a sebaceous cyst on neck. It has been infected and [facility]. He has been on abx  Past Medical History:  Diagnosis Date  . Crohn's colitis [facility](HCC)     Past Surgical History:  Procedure Laterality Date  . ANAL FISSURECTOMY     2010  . COLONOSCOPY    . MOHS SURGERY     2016   OVER LEFT EYE    History reviewed. No pertinent family history. Social History:  reports that he has never smoked. He has never used smokeless tobacco. He reports that he does not drink alcohol or use drugs.  Allergies: No Known Allergies  Medications Prior to Admission  Medication Sig Dispense Refill  . Adalimumab (HUMIRA) 40 MG/0.8ML PSKT Inject 40 mg into the skin every 14 (fourteen) days. Every 2 weeks on Saturdays.      No results found for this or any previous visit (from the past 48 hour(s)). No results found.  Review of Systems  Constitutional: Negative.   HENT: Negative.   Eyes: Negative.   Respiratory: Negative.   Cardiovascular: Negative.   Gastrointestinal: Negative.   Genitourinary: Negative.   Musculoskeletal: Negative.   Skin: Negative.   Neurological: Negative.   Endo/Heme/Allergies: Negative.   Psychiatric/Behavioral: Negative.     Blood pressure 133/81, pulse 76, temperature 98.4 F (36.9 C), temperature source Oral, resp. rate 18, height 5\' 6"  (1.676 m), weight 94.8 kg (209 lb), SpO2 96 %. Physical Exam  Constitutional: He is oriented to person, place, and time. He appears well-developed and well-nourished. No distress.  HENT:  Head: Normocephalic and atraumatic.  Eyes: Conjunctivae and EOM are normal. Pupils are equal, round, and reactive to light.  Neck: Normal range of motion. Neck supple.  There is a large sebaceous cyst on posterior neck  Cardiovascular: Normal rate, regular rhythm and normal heart sounds.    Respiratory: Effort normal and breath sounds normal. No respiratory distress.  GI: Soft. Bowel sounds are normal. There is no tenderness.  Musculoskeletal: Normal range of motion. He exhibits no edema or tenderness.  Neurological: He is alert and oriented to person, place, and time. Coordination normal.  Skin: Skin is warm and dry. No rash noted.  Psychiatric: He has a normal mood and affect. His behavior is normal. Thought content normal.     Assessment/Plan The patient has a large sebaceous cyst on neck that has been infected. In order to prevent it from happening again we are planning to excise the cyst. I have discussed with him in detail the risks and benefits of the surgery as well as some of the technical aspects and he understands and wishes to proceed  Robyne AskewTH III,Roland Lipke S, MD 12/03/2016, 9:36 AM

## 2016-12-03 NOTE — Anesthesia Postprocedure Evaluation (Addendum)
Anesthesia Post Note  Patient: Daniel Mcintyre  Procedure(s) Performed: Procedure(s) (LRB): EXCISION SEBACEOUS CYST NECK (N/A)  Patient location during evaluation: PACU Anesthesia Type: General Level of consciousness: sedated Pain management: pain level controlled Vital Signs Assessment: post-procedure vital signs reviewed and stable Respiratory status: spontaneous breathing and respiratory function stable Cardiovascular status: stable Anesthetic complications: no       Last Vitals:  Vitals:   12/03/16 1100 12/03/16 1116  BP: 139/88 (!) 141/92  Pulse: 88 92  Resp: (!) 8 16  Temp:      Last Pain:  Vitals:   12/03/16 1116  TempSrc:   PainSc: 1                  Noemi Ishmael DANIEL

## 2016-12-03 NOTE — Transfer of Care (Signed)
Immediate Anesthesia Transfer of Care Note  Patient: Daniel Mcintyre  Procedure(s) Performed: Procedure(s): EXCISION SEBACEOUS CYST NECK (N/A)  Patient Location: PACU  Anesthesia Type:General  Level of Consciousness: awake, patient cooperative and responds to stimulation  Airway & Oxygen Therapy: Patient Spontanous Breathing  Post-op Assessment: Report given to RN and Post -op Vital signs reviewed and stable  Post vital signs: Reviewed and stable  Last Vitals:  Vitals:   12/03/16 0759 12/03/16 1046  BP: 133/81   Pulse: 76   Resp: 18   Temp: 36.9 C 36.3 C    Last Pain:  Vitals:   12/03/16 1046  TempSrc:   PainSc: 5       Patients Stated Pain Goal: 4 (12/03/16 0815)  Complications: No apparent anesthesia complications

## 2016-12-03 NOTE — Interval H&P Note (Signed)
History and Physical Interval Note:  12/03/2016 9:40 AM  Hale BogusPeter J Sohm  has presented today for surgery, with the diagnosis of Sebaceous cyst neck  The various methods of treatment have been discussed with the patient and family. After consideration of risks, benefits and other options for treatment, the patient has consented to  Procedure(s): EXCISION SEBACEOUS CYST NECK (N/A) as a surgical intervention .  The patient's history has been reviewed, patient examined, no change in status, stable for surgery.  I have reviewed the patient's chart and labs.  Questions were answered to the patient's satisfaction.     TOTH III,Briaunna Grindstaff S

## 2016-12-04 ENCOUNTER — Encounter (HOSPITAL_COMMUNITY): Payer: Self-pay | Admitting: General Surgery

## 2016-12-14 DIAGNOSIS — B370 Candidal stomatitis: Secondary | ICD-10-CM | POA: Diagnosis not present

## 2016-12-14 DIAGNOSIS — B3789 Other sites of candidiasis: Secondary | ICD-10-CM | POA: Diagnosis not present

## 2016-12-20 NOTE — Addendum Note (Signed)
Addendum  created 12/20/16 1013 by Klani Caridi, MD   Sign clinical note    

## 2017-01-03 DIAGNOSIS — B370 Candidal stomatitis: Secondary | ICD-10-CM | POA: Diagnosis not present

## 2017-03-03 DIAGNOSIS — H40013 Open angle with borderline findings, low risk, bilateral: Secondary | ICD-10-CM | POA: Diagnosis not present

## 2017-06-04 DIAGNOSIS — B379 Candidiasis, unspecified: Secondary | ICD-10-CM | POA: Diagnosis not present

## 2017-06-04 DIAGNOSIS — J209 Acute bronchitis, unspecified: Secondary | ICD-10-CM | POA: Diagnosis not present

## 2017-07-27 DIAGNOSIS — B3784 Candidal otitis externa: Secondary | ICD-10-CM | POA: Diagnosis not present

## 2017-08-17 DIAGNOSIS — H6011 Cellulitis of right external ear: Secondary | ICD-10-CM | POA: Diagnosis not present

## 2017-08-17 DIAGNOSIS — H6010 Cellulitis of external ear, unspecified ear: Secondary | ICD-10-CM | POA: Diagnosis not present

## 2017-11-10 DIAGNOSIS — B356 Tinea cruris: Secondary | ICD-10-CM | POA: Diagnosis not present

## 2017-12-11 DIAGNOSIS — R112 Nausea with vomiting, unspecified: Secondary | ICD-10-CM | POA: Diagnosis not present

## 2017-12-11 DIAGNOSIS — K5000 Crohn's disease of small intestine without complications: Secondary | ICD-10-CM | POA: Diagnosis not present

## 2017-12-11 DIAGNOSIS — R1013 Epigastric pain: Secondary | ICD-10-CM | POA: Diagnosis not present

## 2017-12-11 DIAGNOSIS — R197 Diarrhea, unspecified: Secondary | ICD-10-CM | POA: Diagnosis not present

## 2017-12-11 DIAGNOSIS — K625 Hemorrhage of anus and rectum: Secondary | ICD-10-CM | POA: Diagnosis not present

## 2017-12-16 ENCOUNTER — Other Ambulatory Visit: Payer: Self-pay | Admitting: Physician Assistant

## 2017-12-16 DIAGNOSIS — R197 Diarrhea, unspecified: Secondary | ICD-10-CM

## 2017-12-16 DIAGNOSIS — K5000 Crohn's disease of small intestine without complications: Secondary | ICD-10-CM

## 2017-12-16 DIAGNOSIS — K625 Hemorrhage of anus and rectum: Secondary | ICD-10-CM

## 2017-12-16 DIAGNOSIS — R1013 Epigastric pain: Secondary | ICD-10-CM

## 2017-12-17 DIAGNOSIS — K5000 Crohn's disease of small intestine without complications: Secondary | ICD-10-CM | POA: Diagnosis not present

## 2017-12-25 ENCOUNTER — Ambulatory Visit
Admission: RE | Admit: 2017-12-25 | Discharge: 2017-12-25 | Disposition: A | Discharge status: Home / Self Care | Payer: BLUE CROSS/BLUE SHIELD | Source: Ambulatory Visit | Attending: Physician Assistant | Admitting: Physician Assistant

## 2017-12-25 DIAGNOSIS — K625 Hemorrhage of anus and rectum: Secondary | ICD-10-CM

## 2017-12-25 DIAGNOSIS — R1013 Epigastric pain: Secondary | ICD-10-CM

## 2017-12-25 DIAGNOSIS — R197 Diarrhea, unspecified: Secondary | ICD-10-CM

## 2017-12-25 DIAGNOSIS — K5000 Crohn's disease of small intestine without complications: Secondary | ICD-10-CM

## 2017-12-25 MED ORDER — IOPAMIDOL (ISOVUE-300) INJECTION 61%
100.0000 mL | Freq: Once | INTRAVENOUS | Status: AC | PRN
  Administered 2017-12-25: 100 mL via INTRAVENOUS

## 2018-01-12 DIAGNOSIS — L304 Erythema intertrigo: Secondary | ICD-10-CM | POA: Diagnosis not present

## 2018-01-26 DIAGNOSIS — K5090 Crohn's disease, unspecified, without complications: Secondary | ICD-10-CM | POA: Diagnosis not present

## 2018-02-02 DIAGNOSIS — L304 Erythema intertrigo: Secondary | ICD-10-CM | POA: Diagnosis not present

## 2018-05-11 DIAGNOSIS — H1013 Acute atopic conjunctivitis, bilateral: Secondary | ICD-10-CM | POA: Diagnosis not present

## 2018-05-11 DIAGNOSIS — K5090 Crohn's disease, unspecified, without complications: Secondary | ICD-10-CM | POA: Diagnosis not present

## 2018-08-06 DIAGNOSIS — D1801 Hemangioma of skin and subcutaneous tissue: Secondary | ICD-10-CM | POA: Diagnosis not present

## 2018-08-06 DIAGNOSIS — L820 Inflamed seborrheic keratosis: Secondary | ICD-10-CM | POA: Diagnosis not present

## 2018-08-06 DIAGNOSIS — L218 Other seborrheic dermatitis: Secondary | ICD-10-CM | POA: Diagnosis not present

## 2018-08-06 DIAGNOSIS — L821 Other seborrheic keratosis: Secondary | ICD-10-CM | POA: Diagnosis not present

## 2018-08-06 DIAGNOSIS — L814 Other melanin hyperpigmentation: Secondary | ICD-10-CM | POA: Diagnosis not present

## 2018-08-06 DIAGNOSIS — D485 Neoplasm of uncertain behavior of skin: Secondary | ICD-10-CM | POA: Diagnosis not present

## 2019-02-03 DIAGNOSIS — S70361A Insect bite (nonvenomous), right thigh, initial encounter: Secondary | ICD-10-CM | POA: Diagnosis not present

## 2019-02-15 DIAGNOSIS — K5090 Crohn's disease, unspecified, without complications: Secondary | ICD-10-CM | POA: Diagnosis not present

## 2020-03-19 ENCOUNTER — Other Ambulatory Visit: Payer: Self-pay | Admitting: Otolaryngology

## 2020-03-19 DIAGNOSIS — H918X9 Other specified hearing loss, unspecified ear: Secondary | ICD-10-CM

## 2020-04-09 ENCOUNTER — Ambulatory Visit
Admission: RE | Admit: 2020-04-09 | Discharge: 2020-04-09 | Disposition: A | Discharge status: Home / Self Care | Payer: BLUE CROSS/BLUE SHIELD | Source: Ambulatory Visit | Attending: Otolaryngology | Admitting: Otolaryngology

## 2020-04-09 ENCOUNTER — Other Ambulatory Visit: Payer: Self-pay

## 2020-04-09 DIAGNOSIS — H918X9 Other specified hearing loss, unspecified ear: Secondary | ICD-10-CM

## 2023-03-18 ENCOUNTER — Other Ambulatory Visit: Payer: Self-pay | Admitting: Gastroenterology

## 2023-03-18 ENCOUNTER — Ambulatory Visit
Admission: RE | Admit: 2023-03-18 | Discharge: 2023-03-18 | Disposition: A | Discharge status: Home / Self Care | Payer: 59 | Source: Ambulatory Visit | Attending: Gastroenterology | Admitting: Gastroenterology

## 2023-03-18 DIAGNOSIS — R112 Nausea with vomiting, unspecified: Secondary | ICD-10-CM

## 2023-03-24 ENCOUNTER — Other Ambulatory Visit: Payer: Self-pay | Admitting: Gastroenterology

## 2023-03-24 DIAGNOSIS — R112 Nausea with vomiting, unspecified: Secondary | ICD-10-CM

## 2023-03-27 ENCOUNTER — Ambulatory Visit
Admission: RE | Admit: 2023-03-27 | Discharge: 2023-03-27 | Disposition: A | Discharge status: Home / Self Care | Payer: 59 | Source: Ambulatory Visit | Attending: Gastroenterology | Admitting: Gastroenterology

## 2023-03-27 DIAGNOSIS — R112 Nausea with vomiting, unspecified: Secondary | ICD-10-CM

## 2023-04-01 ENCOUNTER — Other Ambulatory Visit: Payer: Self-pay | Admitting: Gastroenterology

## 2023-04-01 DIAGNOSIS — K5090 Crohn's disease, unspecified, without complications: Secondary | ICD-10-CM

## 2023-04-06 ENCOUNTER — Ambulatory Visit
Admission: RE | Admit: 2023-04-06 | Discharge: 2023-04-06 | Disposition: A | Discharge status: Home / Self Care | Payer: 59 | Source: Ambulatory Visit | Attending: Gastroenterology | Admitting: Gastroenterology

## 2023-04-06 DIAGNOSIS — K5090 Crohn's disease, unspecified, without complications: Secondary | ICD-10-CM

## 2023-04-06 MED ORDER — IOPAMIDOL (ISOVUE-300) INJECTION 61%
100.0000 mL | Freq: Once | INTRAVENOUS | Status: AC | PRN
  Administered 2023-04-06: 100 mL via INTRAVENOUS

## 2023-04-21 ENCOUNTER — Other Ambulatory Visit (HOSPITAL_COMMUNITY): Payer: Self-pay | Admitting: Gastroenterology

## 2023-04-21 DIAGNOSIS — R112 Nausea with vomiting, unspecified: Secondary | ICD-10-CM

## 2023-05-08 ENCOUNTER — Encounter (HOSPITAL_COMMUNITY): Payer: Self-pay

## 2023-05-08 ENCOUNTER — Encounter (HOSPITAL_COMMUNITY): Payer: 59

## 2023-05-12 ENCOUNTER — Encounter (HOSPITAL_COMMUNITY)
Admission: RE | Admit: 2023-05-12 | Discharge: 2023-05-12 | Disposition: A | Discharge status: Home / Self Care | Payer: 59 | Source: Ambulatory Visit | Attending: Gastroenterology | Admitting: Gastroenterology

## 2023-05-12 DIAGNOSIS — R112 Nausea with vomiting, unspecified: Secondary | ICD-10-CM | POA: Insufficient documentation

## 2023-05-12 MED ORDER — TECHNETIUM TC 99M MEBROFENIN IV KIT
5.0000 | PACK | Freq: Once | INTRAVENOUS | Status: AC | PRN
  Administered 2023-05-12: 5 via INTRAVENOUS

## 2023-05-15 ENCOUNTER — Other Ambulatory Visit (HOSPITAL_COMMUNITY): Payer: Self-pay | Admitting: Gastroenterology

## 2023-05-15 DIAGNOSIS — R112 Nausea with vomiting, unspecified: Secondary | ICD-10-CM

## 2023-05-27 ENCOUNTER — Encounter (HOSPITAL_COMMUNITY)
Admission: RE | Admit: 2023-05-27 | Discharge: 2023-05-27 | Disposition: A | Discharge status: Home / Self Care | Payer: 59 | Source: Ambulatory Visit | Attending: Gastroenterology | Admitting: Gastroenterology

## 2023-05-27 DIAGNOSIS — R112 Nausea with vomiting, unspecified: Secondary | ICD-10-CM | POA: Insufficient documentation

## 2023-05-27 MED ORDER — TECHNETIUM TC 99M SULFUR COLLOID
2.0000 | Freq: Once | INTRAVENOUS | Status: AC | PRN
  Administered 2023-05-27: 2 via INTRAVENOUS
